# Patient Record
Sex: Male | Born: 1955 | Race: White | Hispanic: No | Marital: Married | Smoking: Former smoker
Health system: Southern US, Community
[De-identification: ages and names within clinical notes are randomized; demographics above are authoritative.]

## PROBLEM LIST (undated history)

## (undated) DIAGNOSIS — F988 Other specified behavioral and emotional disorders with onset usually occurring in childhood and adolescence: Secondary | ICD-10-CM

## (undated) DIAGNOSIS — F32A Depression, unspecified: Secondary | ICD-10-CM

## (undated) DIAGNOSIS — K219 Gastro-esophageal reflux disease without esophagitis: Secondary | ICD-10-CM

## (undated) DIAGNOSIS — F329 Major depressive disorder, single episode, unspecified: Secondary | ICD-10-CM

## (undated) DIAGNOSIS — T7840XA Allergy, unspecified, initial encounter: Secondary | ICD-10-CM

## (undated) HISTORY — PX: HERNIA REPAIR: SHX51

## (undated) HISTORY — DX: Allergy, unspecified, initial encounter: T78.40XA

## (undated) HISTORY — DX: Gastro-esophageal reflux disease without esophagitis: K21.9

## (undated) HISTORY — PX: LASIK: SHX215

---

## 2000-04-22 ENCOUNTER — Ambulatory Visit (HOSPITAL_COMMUNITY): Admission: RE | Admit: 2000-04-22 | Discharge: 2000-04-22 | Payer: Self-pay | Admitting: *Deleted

## 2000-04-22 ENCOUNTER — Encounter: Payer: Self-pay | Admitting: Internal Medicine

## 2001-06-20 ENCOUNTER — Ambulatory Visit (HOSPITAL_BASED_OUTPATIENT_CLINIC_OR_DEPARTMENT_OTHER): Admission: RE | Admit: 2001-06-20 | Discharge: 2001-06-20 | Payer: Self-pay

## 2002-04-13 ENCOUNTER — Ambulatory Visit (HOSPITAL_COMMUNITY): Admission: RE | Admit: 2002-04-13 | Discharge: 2002-04-13 | Payer: Self-pay | Admitting: Internal Medicine

## 2002-04-13 ENCOUNTER — Encounter: Payer: Self-pay | Admitting: Internal Medicine

## 2004-05-09 ENCOUNTER — Ambulatory Visit (HOSPITAL_COMMUNITY): Admission: RE | Admit: 2004-05-09 | Discharge: 2004-05-09 | Payer: Self-pay | Admitting: Internal Medicine

## 2005-05-25 ENCOUNTER — Ambulatory Visit: Payer: Self-pay | Admitting: Gastroenterology

## 2005-06-29 ENCOUNTER — Ambulatory Visit: Payer: Self-pay | Admitting: Gastroenterology

## 2010-07-06 ENCOUNTER — Encounter: Payer: Self-pay | Admitting: Gastroenterology

## 2010-07-20 NOTE — Letter (Signed)
Summary: Colonoscopy Date Change Letter  [facility]  [address]   [place], [place] [address]   Phone: [phone]  Fax: [phone]      July 06, 2010 MRN: [id number]   [name] [address] [place], [place]  [address]   Dear Mr. MCKEITHAN,   Previously you were recommended to have a repeat colonoscopy around this time. Your chart was recently reviewed by Dr. Jarold Motto of [facility]. Follow up colonoscopy is now recommended in January 2017. This revised recommendation is based on current, nationally recognized guidelines for colorectal cancer screening and polyp surveillance. These guidelines are endorsed by the American Cancer Society, [employer] on Colorectal Cancer as well as numerous other major medical organizations.  Please understand that our recommendation assumes that you do not have any new symptoms such as bleeding, a change in bowel habits, anemia, or significant abdominal discomfort. If you do have any concerning GI symptoms or want to discuss the guideline recommendations, please call to arrange an office visit at your earliest convenience. Otherwise we will keep you in our reminder system and contact you 1-2 months prior to the date listed above to schedule your next colonoscopy.  Thank you,   [facility] [phone]

## 2010-11-13 ENCOUNTER — Emergency Department (HOSPITAL_COMMUNITY): Payer: 59

## 2010-11-13 ENCOUNTER — Emergency Department (HOSPITAL_COMMUNITY)
Admission: EM | Admit: 2010-11-13 | Discharge: 2010-11-13 | Disposition: A | Discharge status: Home / Self Care | Payer: 59 | Attending: Emergency Medicine | Admitting: Emergency Medicine

## 2010-11-13 DIAGNOSIS — Y93E9 Activity, other interior property and clothing maintenance: Secondary | ICD-10-CM | POA: Insufficient documentation

## 2010-11-13 DIAGNOSIS — S62109A Fracture of unspecified carpal bone, unspecified wrist, initial encounter for closed fracture: Secondary | ICD-10-CM | POA: Insufficient documentation

## 2010-11-13 DIAGNOSIS — R296 Repeated falls: Secondary | ICD-10-CM | POA: Insufficient documentation

## 2011-02-03 ENCOUNTER — Emergency Department (HOSPITAL_COMMUNITY): Payer: 59

## 2011-02-03 ENCOUNTER — Observation Stay (HOSPITAL_COMMUNITY)
Admission: EM | Admit: 2011-02-03 | Discharge: 2011-02-04 | Disposition: A | Discharge status: Home / Self Care | Payer: 59 | Attending: Internal Medicine | Admitting: Internal Medicine

## 2011-02-03 DIAGNOSIS — F988 Other specified behavioral and emotional disorders with onset usually occurring in childhood and adolescence: Secondary | ICD-10-CM | POA: Insufficient documentation

## 2011-02-03 DIAGNOSIS — R110 Nausea: Secondary | ICD-10-CM | POA: Insufficient documentation

## 2011-02-03 DIAGNOSIS — G319 Degenerative disease of nervous system, unspecified: Secondary | ICD-10-CM | POA: Insufficient documentation

## 2011-02-03 DIAGNOSIS — R42 Dizziness and giddiness: Secondary | ICD-10-CM | POA: Insufficient documentation

## 2011-02-03 DIAGNOSIS — R9431 Abnormal electrocardiogram [ECG] [EKG]: Secondary | ICD-10-CM | POA: Insufficient documentation

## 2011-02-03 DIAGNOSIS — R51 Headache: Secondary | ICD-10-CM | POA: Insufficient documentation

## 2011-02-03 DIAGNOSIS — R55 Syncope and collapse: Principal | ICD-10-CM | POA: Insufficient documentation

## 2011-02-03 DIAGNOSIS — F3289 Other specified depressive episodes: Secondary | ICD-10-CM | POA: Insufficient documentation

## 2011-02-03 DIAGNOSIS — F329 Major depressive disorder, single episode, unspecified: Secondary | ICD-10-CM | POA: Insufficient documentation

## 2011-02-03 LAB — DIFFERENTIAL
Eosinophils Relative: 1 % (ref 0–5)
Lymphocytes Relative: 15 % (ref 12–46)
Lymphs Abs: 1.2 10*3/uL (ref 0.7–4.0)
Monocytes Absolute: 0.5 10*3/uL (ref 0.1–1.0)
Monocytes Relative: 6 % (ref 3–12)

## 2011-02-03 LAB — CK TOTAL AND CKMB (NOT AT ARMC): CK, MB: 3.3 ng/mL (ref 0.3–4.0)

## 2011-02-03 LAB — BASIC METABOLIC PANEL
BUN: 10 mg/dL (ref 6–23)
Creatinine, Ser: 0.79 mg/dL (ref 0.50–1.35)
GFR calc Af Amer: >60 mL/min (ref >60)
GFR calc non Af Amer: >60 mL/min (ref >60)

## 2011-02-03 LAB — CBC
HCT: 41.1 % (ref 39.0–52.0)
MCHC: 34.3 g/dL (ref 30.0–36.0)
MCV: 95.1 fL (ref 78.0–100.0)
RDW: 12.7 % (ref 11.5–15.5)

## 2011-02-03 LAB — D-DIMER, QUANTITATIVE: D-Dimer, Quant: <0.22 ug/mL-FEU (ref 0.00–0.48)

## 2011-02-03 LAB — TROPONIN I: Troponin I: <0.3 ng/mL (ref <0.30)

## 2011-02-03 LAB — GLUCOSE, CAPILLARY: Glucose-Capillary: 110 mg/dL — ABNORMAL HIGH (ref 70–99)

## 2011-02-04 ENCOUNTER — Observation Stay (HOSPITAL_COMMUNITY): Payer: 59

## 2011-02-04 DIAGNOSIS — R55 Syncope and collapse: Secondary | ICD-10-CM

## 2011-02-04 LAB — CK TOTAL AND CKMB (NOT AT ARMC)
Relative Index: 3 — ABNORMAL HIGH (ref 0.0–2.5)
Relative Index: INVALID (ref 0.0–2.5)

## 2011-02-04 LAB — TSH: TSH: 0.502 u[IU]/mL (ref 0.350–4.500)

## 2011-02-13 NOTE — Discharge Summary (Signed)
[name], [name]             ACCOUNT NO.:  [id number]  MEDICAL RECORD NO.:  [id number]  LOCATION:  1442                         FACILITY:  [facility]  PHYSICIAN:  Erick Blinks, MD     DATE OF BIRTH:  [birth year]  DATE OF ADMISSION:  02/03/2011 DATE OF DISCHARGE:  02/04/2011                              DISCHARGE SUMMARY   PRIMARY CARE PHYSICIAN:  Lovenia Kim, DO  DISCHARGE DIAGNOSES: 1. Syncope, likely vasovagal, symptoms resolved. 2. Depression. 3. Attention-deficit disorder.  DISCHARGE MEDICATIONS: 1. Lexapro 10 mg p.o. daily. 2. Vyvanse 10 mg p.o. daily. 3. Aspirin 81 mg p.o. daily.  ADMISSION HISTORY:  This is a 55 year old gentleman who has a history of depression and attention-deficit disorder.  The patient was at home when he started to feel poorly, was having some clamminess.  The patient started to feel dizzy.  He had called his wife and upon her arrival at home, she had found him lying on the floor.  He was not unconscious at the time of her arrival, although he did pass out and reports that he does not remember falling to the floor.  His symptoms persisted for 30 to 40 minutes.  He was having some nausea and dizziness, so he was brought to the emergency room and was given some IV fluids.  He was subsequently admitted for further evaluation.  His wife, who is a Engineer, civil (consulting), had stated that she was checking his pulse and noticed that she was feeling some skipped beats.  HOSPITAL COURSE:  Syncope:  The patient was extensively evaluated.  His orthostatics were found to be negative.  He did have an MRI of the brain done which ruled out any acute process.  Cardiac enzymes were found to be negative x3.  Telemetry shows that he has a sinus rhythm without any evidence of PVCs or PACs.  TSH was also found to be normal.  D-dimer was negative.  2-D echocardiogram has been done and results are currently pending.  If his 2-D echo is within normal limits, then he could  likely discharge home at this point and follow up with his primary care physician.  The patient may need a referral to a cardiologist for Event Monitor/Loop Recorder if his symptoms do recur, but this may be conducted on an outpatient basis.  Currently, he is back to his baseline.  He is ambulating without any problems and is requesting to discharge home.  DIAGNOSTIC IMAGING: 1. MRI of the brain shows no acute intracranial findings, major     intracranial vasculature structures patent, minimal atrophy and     minimal white-matter disease. 2. CT head, on admission, shows no acute intracranial abnormalities.  DISCHARGE INSTRUCTIONS:  The patient should continue on a regular diet, conduct his activities as tolerated.  Follow up with his primary care physician in the next 1 to 2 weeks.  Again, the patient's final echo report is pending.  If that is within normal limits, then he will be discharged home later today.     Erick Blinks, MD     JM/MEDQ  D:  02/04/2011  T:  02/04/2011  Job:  [id number]  cc:   Lovenia Kim, D.O.Fax: [phone]  Electronically Signed by Erick Blinks  on 02/13/2011 04:44:11 PM

## 2011-03-28 NOTE — H&P (Signed)
NAMELANDON, Wise NO.:  [id number]  MEDICAL RECORD NO.:  [id number]  LOCATION:  WLED                         FACILITY:  [facility]  PHYSICIAN:  Altha Harm, MDDATE OF BIRTH:  05/20/1956  DATE OF ADMISSION:  02/03/2011 DATE OF DISCHARGE:                             HISTORY & PHYSICAL   CHIEF COMPLAINT:  Syncope.  HISTORY OF PRESENT ILLNESS:  [name] is a 55 year old gentleman with history only of depression and ADD.  He states that on today, he was sitting looking at Television while drinking a beer, noticed that he was having some clamminess and feeling poorly.  The patient said at that time he did not feel any dizziness.  He was able to get up and walk down to the basement, called his wife  on the phone and then come back upstairs then.  The patient spoke with his wife and is a conversation and when the wife arrived at home she found him on the floor of the den. She states when she found him,  he was not passed out, but he did lose some time as he does not remember getting to the floor.  At that time, she said he felt poorly.  The patient states that he sat on the couch and then subsequent to that started having some dizziness.  The patient's wife believes that it was while he was on the floor that he complained of dizziness.  Nevertheless, dizziness he described is more like  motion sickness, which improves when he close his eyes.  He states that persisted for about 30-40 minutes between getting from the house to the emergency room and shortly after being here.  The patient was given some meclizine in the emergency room, which appeared to improve his dizziness.  He was also given some IV fluids.  The patient denies any seizure activity.  He denies any chest pain.  He denies any chest wall tightness.  He denies any loss of bowel or bladder function.  He denies any abdominal pain.  He did have some nausea, which was associated with his first  onset of symptoms, which included the clamminess.  The patient states that since being here in the emergency room, he feels back to his baseline.  He has no further dizziness.  He has no nausea and he has no changes in vision.  The patient did not describe any changes in vision during the episode that he described.  He denies any palpitations, however, his wife who is a nurse states that when she evaluated his pulse, she felt that he was skipping some beats, she states she would feel 4 beats and then a pause and then 9 beats and then a pause in the fashion of someone who is having PVCs or PACs.  PAST MEDICAL HISTORY: 1. Depression. 2. Attention deficit disorder.  FAMILY HISTORY:  Significant for cancer, melanoma in his father and siblings and leukemia in his mother.  SOCIAL HISTORY:  He lives with his wife who can be reached at [phone].  He admits to drinking alcohol occasionally socially.  There is no tobacco use and he denies any drug use.  The patient states that prior  to this they have been doing some painting in a well air-conditioned basement and he had had no symptoms with that.  He states that today was the day that was less strenuous for him than those.  CURRENT MEDICATIONS: 1. Lexapro 10 mg p.o. daily. 2. Vyvanse 10 mg p.o. daily.  ALLERGIES:  No known drug allergies.  PRIMARY CARE PHYSICIAN:  Lovenia Kim, D.O.  REVIEW OF SYSTEMS:  All other systems negative except as noted in the HPI.  LABORATORY DATA:  Studies in the emergency room show the following, white blood cell count of 7.8, hemoglobin of 14.4, hematocrit of 41.1, platelet count of 196.  Sodium 132, potassium 3.6, chloride 97, bicarb 23, BUN 10, creatinine 0.79, troponin 0.00.  CT of head is negative for any acute intracranial abnormalities.  PHYSICAL EXAMINATION:  GENERAL:  The patient is well appearing, laying in bed with no distress, and not complaining of any symptoms at this time. VITAL  SIGNS:  Temperature is 98, heart rate 96, blood pressure 124/85, respiratory rate 20, O2 sats are 100% on room air. HEENT:  He is normocephalic, atraumatic.  Pupils equally round and reactive to light and accommodation.  Extraocular movements are intact. Oropharynx is moist.  No exudate, erythema or lesions are noted. NECK:  Trachea is midline.  No masses.  No thyromegaly.  No JVD.  No carotid bruit. RESPIRATORY:  He has got a normal respiratory effort, equal excursion bilaterally.  No wheezing or rhonchi noted. CARDIOVASCULAR:  He has got a normal S1, S2.  No murmurs, rubs, or gallops noted.  PMI is nondisplaced.  No heaves or thrills on palpation. ABDOMEN:  Obese, soft, nontender, nondistended.  No masses.  No hepatosplenomegaly is noted. EXTREMITIES:  No clubbing, cyanosis, or edema. LYMPH NODE SURVEY:  He has got no cervical, axillary, or inguinal lymphadenopathy noted. PSYCHIATRIC:  He is alert and oriented x3.  Good insight and cognition. Good recent and remote recall. NEUROLOGIC:  He has got no focal neurological deficits.  Cranial nerves II through XII are grossly intact.  DTRs are 2+ bilaterally in the upper and lower extremities.  Please note, the patient had some very, very minuscule amount of nystagmus horizontally to the right, but no other abnormalities noted.  ASSESSMENT/PLAN:  This patient who presents with syncope.  His symptoms point to likely some arrhythmia causing a decrease in perfusion.  It is very unlikely that this patient could have a neurological event that would have a reversal of symptoms separately.  The other concern would be for pulmonary embolus with symptoms occurring at the time that the embolus was occurring.  In light of these concerns, I will go ahead and get a 2-D echocardiogram for the patient and admit the patient to telemetry unit and monitor his heart rhythm.  We will get a D-dimer to rule out suggestion of a pulmonary embolus.  I will also  get an MRI of the brain to ensure that the patient does not have any posterior fossa abnormalities though less likely.  It is also quite possible this could have been vasovagal for the patient, however, he does not describe anything leading up to this trigger vasovagal response.  We will observe the patient on telemetry, continue his home medications, and check orthostatics on him to make sure that he does have some component of volume depletion.  However, overall this patient describes symptoms that go along with decreased perfusion at the time of the event, which was point, most significantly to the  cardiovascular system.  We will go ahead and do deep venous thrombosis prophylaxis with Lovenox and place the patient on a regular diet. Further therapy will be determined by testing and the patient's clinical course and the patient's initial response to therapy.  Please note that I have not given the patient any meclizine at this time as he has no symptoms at present, however, if the symptoms recurred may be a consideration for him.     Altha Harm, MD     MAM/MEDQ  D:  02/03/2011  T:  02/03/2011  Job:  [id number]  cc:   Lovenia Kim, D.O. Fax: [phone]  Electronically Signed by Marthann Schiller MD on 03/28/2011 11:26:28 PM

## 2011-04-29 ENCOUNTER — Encounter: Payer: Self-pay | Admitting: *Deleted

## 2011-04-29 ENCOUNTER — Emergency Department (HOSPITAL_COMMUNITY)
Admission: EM | Admit: 2011-04-29 | Discharge: 2011-04-30 | Disposition: A | Discharge status: Home / Self Care | Payer: 59 | Attending: Emergency Medicine | Admitting: Emergency Medicine

## 2011-04-29 DIAGNOSIS — W010XXA Fall on same level from slipping, tripping and stumbling without subsequent striking against object, initial encounter: Secondary | ICD-10-CM | POA: Insufficient documentation

## 2011-04-29 DIAGNOSIS — R404 Transient alteration of awareness: Secondary | ICD-10-CM | POA: Insufficient documentation

## 2011-04-29 DIAGNOSIS — R55 Syncope and collapse: Secondary | ICD-10-CM | POA: Insufficient documentation

## 2011-04-29 DIAGNOSIS — R220 Localized swelling, mass and lump, head: Secondary | ICD-10-CM | POA: Insufficient documentation

## 2011-04-29 DIAGNOSIS — R221 Localized swelling, mass and lump, neck: Secondary | ICD-10-CM | POA: Insufficient documentation

## 2011-04-29 DIAGNOSIS — S0180XA Unspecified open wound of other part of head, initial encounter: Secondary | ICD-10-CM | POA: Insufficient documentation

## 2011-04-29 DIAGNOSIS — S0181XA Laceration without foreign body of other part of head, initial encounter: Secondary | ICD-10-CM

## 2011-04-29 DIAGNOSIS — W19XXXA Unspecified fall, initial encounter: Secondary | ICD-10-CM

## 2011-04-29 DIAGNOSIS — S0510XA Contusion of eyeball and orbital tissues, unspecified eye, initial encounter: Secondary | ICD-10-CM | POA: Insufficient documentation

## 2011-04-29 DIAGNOSIS — R413 Other amnesia: Secondary | ICD-10-CM | POA: Insufficient documentation

## 2011-04-29 DIAGNOSIS — S0990XA Unspecified injury of head, initial encounter: Secondary | ICD-10-CM | POA: Insufficient documentation

## 2011-04-29 HISTORY — DX: Major depressive disorder, single episode, unspecified: F32.9

## 2011-04-29 HISTORY — DX: Other specified behavioral and emotional disorders with onset usually occurring in childhood and adolescence: F98.8

## 2011-04-29 HISTORY — DX: Depression, unspecified: F32.A

## 2011-04-29 MED ORDER — OXYCODONE-ACETAMINOPHEN 5-325 MG PO TABS
1.0000 | ORAL_TABLET | Freq: Once | ORAL | Status: AC
  Administered 2011-04-30: 1 via ORAL
  Filled 2011-04-29: qty 1

## 2011-04-29 MED ORDER — TETANUS-DIPHTH-ACELL PERTUSSIS 5-2.5-18.5 LF-MCG/0.5 IM SUSP
0.5000 mL | Freq: Once | INTRAMUSCULAR | Status: AC
  Administered 2011-04-30: 0.5 mL via INTRAMUSCULAR
  Filled 2011-04-29: qty 0.5

## 2011-04-29 NOTE — ED Notes (Signed)
Pts wife states pt tripped over table fell forward hitting face on tile floor, pts wife state he was unconscious x 1 minute. Pt does not remember incident, remembers watching TV then sitting in chair with family around him. Pt placed in philly collar in triage, lac to forehead bandaged, no active bleeding. cspine non tender. L cheek swollen discolored. Pt c/o pain to L elbow, small superficial lac x 2 noted to elbow.

## 2011-04-29 NOTE — ED Notes (Signed)
Pt tripped and fell, striking L head. Positive LoC, amnesia, head injury/laceration, bleeding controlled w/ bandage.

## 2011-04-29 NOTE — ED Provider Notes (Signed)
History     CSN: [id number] Arrival date & time: 04/29/2011  9:07 PM   First MD Initiated Contact with Patient 04/29/11 2307        (Consider location/radiation/quality/duration/timing/severity/associated sxs/prior treatment) Patient is a 55 y.o. male presenting with fall, head injury, and scalp laceration. The history is provided by the patient and the spouse.  Fall The accident occurred less than 1 hour ago. The fall occurred while walking (He was walking behind a couch and tripped on the leg of a branch that was in an unusual position behind the couch). He fell from a height of 1 to 2 ft. He landed on a hard floor. The point of impact was the head. The pain is moderate. He was ambulatory at the scene. There was no entrapment after the fall. There was no drug use involved in the accident. There was alcohol use involved in the accident. Associated symptoms include loss of consciousness. Pertinent negatives include no visual change, no fever, no abdominal pain, no bowel incontinence, no vomiting and no hearing loss. Associated symptoms comments: Patient was unconscious for 1 minute after the fall. He is amnestic for the event. He does remember getting cleaned up after the event.. The symptoms are aggravated by activity. He has tried nothing for the symptoms.  Head Injury  Pertinent negatives include no vomiting.  Head Laceration Pertinent negatives include no abdominal pain.    Past Medical History  Diagnosis Date  . Depression   . Attention deficit disorder (ADD)     Past Surgical History  Procedure Date  . Hernia repair   . Lasik     No family history on file.  History  Substance Use Topics  . Smoking status: Former Games developer  . Smokeless tobacco: Not on file  . Alcohol Use: 1.2 oz/week    2 Cans of beer per week      Review of Systems  Constitutional: Negative for fever.  Cardiovascular: Positive for syncope.  Gastrointestinal: Negative for vomiting, abdominal pain and  bowel incontinence.  Neurological: Positive for loss of consciousness.  All other systems reviewed and are negative.    Allergies  Review of patient's allergies indicates no known allergies.  Home Medications   Current Outpatient Rx  Name Route Sig Dispense Refill  . AMPHETAMINE-DEXTROAMPHETAMINE 10 MG PO TABS Oral Take 10 mg by mouth daily.      Marland Kitchen ESCITALOPRAM OXALATE 10 MG PO TABS Oral Take 10 mg by mouth daily.        BP 120/76  Pulse 64  Temp 98 F (36.7 C)  Resp 20  SpO2 100%  Physical Exam  Constitutional: He is oriented to person, place, and time. He appears well-developed and well-nourished.  HENT:  Head: Normocephalic.    Right Ear: External ear normal.  Left Ear: External ear normal.  Nose: Nose normal.  Mouth/Throat: Uvula is midline, oropharynx is clear and moist and mucous membranes are normal.       Left periorbital contusions. Left eyebrow laceration.  Eyes: EOM are normal. Pupils are equal, round, and reactive to light. Right eye exhibits no chemosis. Left eye exhibits no chemosis. Right conjunctiva is not injected. Left conjunctiva is not injected.         Left periorbital contusions  Neck: Neck supple.       Wearing a cervical collar applied at triage, no neck tenderness.  Cardiovascular: Normal rate and regular rhythm.   Pulmonary/Chest: Effort normal and breath sounds normal.  Abdominal: Soft. Bowel sounds  are normal.  Genitourinary:       No costal vertebral angle tenderness  Musculoskeletal: Normal range of motion. He exhibits no edema and no tenderness.  Neurological: He is alert and oriented to person, place, and time. No cranial nerve deficit. Coordination normal.  Skin: Skin is warm and dry.  Psychiatric: He has a normal mood and affect. His behavior is normal. Judgment and thought content normal.    ED Course  Procedures (including critical care time) LACERATION REPAIR Performed by: Flint Melter Consent: Verbal consent  obtained. Risks and benefits: risks, benefits and alternatives were discussed Patient identity confirmed: provided demographic data Time out performed prior to procedure Prepped and Draped in normal sterile fashion Wound explored  Laceration Location: left eyebrow  Laceration Length: 4.5cm  No Foreign Bodies seen or palpated  Anesthesia: local infiltration  Local anesthetic: lidocaine 2% with epinephrine  Anesthetic total: 4 ml  Irrigation method: syringe Amount of cleaning: standard  Skin closure: prolene  Number of sutures or staples: 6  Technique: simple  Patient tolerance: Patient tolerated the procedure well with no immediate complications. Labs Reviewed - No data to display Ct Head Wo Contrast  04/30/2011  *RADIOLOGY REPORT*  Clinical Data:  55 year old male status post fall with loss of consciousness.  Laceration, swelling.  CT HEAD WITHOUT CONTRAST CT ORBITS WITHOUT CONTRAST CT CERVICAL SPINE WITHOUT CONTRAST  Technique:  Multidetector CT imaging of the head, cervical spine, and orbit structures were performed using the standard protocol without intravenous contrast. Multiplanar CT image reconstructions of the cervical spine and maxillofacial structures were also generated.  Comparison:  Brain MRI 02/04/2011.  Head CT 02/03/2011.  CT HEAD  Findings: Orbit findings are below.  Mastoids are clear.  Calvarium intact.  Cerebral volume is within normal limits for age.  No midline shift, ventriculomegaly, mass effect, evidence of mass lesion, intracranial hemorrhage or evidence of cortically based acute infarction.  Gray-white matter differentiation is within normal limits throughout the brain.  No suspicious intracranial vascular hyperdensity.  IMPRESSION: 1.  Stable, normal noncontrast CT appearance of the brain. 2.  See orbit findings below.  CT ORBITS  Findings:  Punctate to probable retained foreign bodies about the inferolateral aspect of the left orbit (series 6 image 21).   Globes intact.  Intraorbital soft tissues within normal limits.  Minor ethmoid sinus mucosal thickening.  Chronic inferior left maxillary mucous retention cyst.  Chronic-appearing left anterior zygomatic arch fracture.  No anal orbital wall fracture or acute nearby facial fracture identified. Soft tissue laceration superior to the left orbit (series 5 image 37) underlying frontal bone appears intact.  IMPRESSION: 1.  Left periorbital soft tissue injury without associated fracture. 2.  Suspect punctate retained foreign bodies along the inferolateral left orbit (series 6 image 21). 3.  Cervical findings are below.  CT CERVICAL SPINE  Findings:   Visualized skull base is intact.  No atlanto-occipital dissociation.  Mild reversal of cervical lordosis. Cervicothoracic junction alignment is within normal limits.  Bilateral posterior element alignment is within normal limits.  Visualized paraspinal soft tissues are within normal limits.  No acute cervical spine fracture.  Negative lung apices.  Chronic disc degeneration at C4- C5 and C5-C6 including endplate geode with vacuum phenomena.  No definite cervical spinal stenosis.  IMPRESSION: No acute fracture or listhesis identified in the cervical spine. Ligamentous injury is not excluded.  Original Report Authenticated By: Harley Hallmark, M.D.   Ct Cervical Spine Wo Contrast  04/30/2011  *RADIOLOGY REPORT*  Clinical  Data:  55 year old male status post fall with loss of consciousness.  Laceration, swelling.  CT HEAD WITHOUT CONTRAST CT ORBITS WITHOUT CONTRAST CT CERVICAL SPINE WITHOUT CONTRAST  Technique:  Multidetector CT imaging of the head, cervical spine, and orbit structures were performed using the standard protocol without intravenous contrast. Multiplanar CT image reconstructions of the cervical spine and maxillofacial structures were also generated.  Comparison:  Brain MRI 02/04/2011.  Head CT 02/03/2011.  CT HEAD  Findings: Orbit findings are below.  Mastoids  are clear.  Calvarium intact.  Cerebral volume is within normal limits for age.  No midline shift, ventriculomegaly, mass effect, evidence of mass lesion, intracranial hemorrhage or evidence of cortically based acute infarction.  Gray-white matter differentiation is within normal limits throughout the brain.  No suspicious intracranial vascular hyperdensity.  IMPRESSION: 1.  Stable, normal noncontrast CT appearance of the brain. 2.  See orbit findings below.  CT ORBITS  Findings:  Punctate to probable retained foreign bodies about the inferolateral aspect of the left orbit (series 6 image 21).  Globes intact.  Intraorbital soft tissues within normal limits.  Minor ethmoid sinus mucosal thickening.  Chronic inferior left maxillary mucous retention cyst.  Chronic-appearing left anterior zygomatic arch fracture.  No anal orbital wall fracture or acute nearby facial fracture identified. Soft tissue laceration superior to the left orbit (series 5 image 37) underlying frontal bone appears intact.  IMPRESSION: 1.  Left periorbital soft tissue injury without associated fracture. 2.  Suspect punctate retained foreign bodies along the inferolateral left orbit (series 6 image 21). 3.  Cervical findings are below.  CT CERVICAL SPINE  Findings:   Visualized skull base is intact.  No atlanto-occipital dissociation.  Mild reversal of cervical lordosis. Cervicothoracic junction alignment is within normal limits.  Bilateral posterior element alignment is within normal limits.  Visualized paraspinal soft tissues are within normal limits.  No acute cervical spine fracture.  Negative lung apices.  Chronic disc degeneration at C4- C5 and C5-C6 including endplate geode with vacuum phenomena.  No definite cervical spinal stenosis.  IMPRESSION: No acute fracture or listhesis identified in the cervical spine. Ligamentous injury is not excluded.  Original Report Authenticated By: Harley Hallmark, M.D.   Ct Orbitss W/o Cm  04/30/2011   *RADIOLOGY REPORT*  Clinical Data:  55 year old male status post fall with loss of consciousness.  Laceration, swelling.  CT HEAD WITHOUT CONTRAST CT ORBITS WITHOUT CONTRAST CT CERVICAL SPINE WITHOUT CONTRAST  Technique:  Multidetector CT imaging of the head, cervical spine, and orbit structures were performed using the standard protocol without intravenous contrast. Multiplanar CT image reconstructions of the cervical spine and maxillofacial structures were also generated.  Comparison:  Brain MRI 02/04/2011.  Head CT 02/03/2011.  CT HEAD  Findings: Orbit findings are below.  Mastoids are clear.  Calvarium intact.  Cerebral volume is within normal limits for age.  No midline shift, ventriculomegaly, mass effect, evidence of mass lesion, intracranial hemorrhage or evidence of cortically based acute infarction.  Gray-white matter differentiation is within normal limits throughout the brain.  No suspicious intracranial vascular hyperdensity.  IMPRESSION: 1.  Stable, normal noncontrast CT appearance of the brain. 2.  See orbit findings below.  CT ORBITS  Findings:  Punctate to probable retained foreign bodies about the inferolateral aspect of the left orbit (series 6 image 21).  Globes intact.  Intraorbital soft tissues within normal limits.  Minor ethmoid sinus mucosal thickening.  Chronic inferior left maxillary mucous retention cyst.  Chronic-appearing left anterior  zygomatic arch fracture.  No anal orbital wall fracture or acute nearby facial fracture identified. Soft tissue laceration superior to the left orbit (series 5 image 37) underlying frontal bone appears intact.  IMPRESSION: 1.  Left periorbital soft tissue injury without associated fracture. 2.  Suspect punctate retained foreign bodies along the inferolateral left orbit (series 6 image 21). 3.  Cervical findings are below.  CT CERVICAL SPINE  Findings:   Visualized skull base is intact.  No atlanto-occipital dissociation.  Mild reversal of cervical  lordosis. Cervicothoracic junction alignment is within normal limits.  Bilateral posterior element alignment is within normal limits.  Visualized paraspinal soft tissues are within normal limits.  No acute cervical spine fracture.  Negative lung apices.  Chronic disc degeneration at C4- C5 and C5-C6 including endplate geode with vacuum phenomena.  No definite cervical spinal stenosis.  IMPRESSION: No acute fracture or listhesis identified in the cervical spine. Ligamentous injury is not excluded.  Original Report Authenticated By: Harley Hallmark, M.D.   2:35 AM  patient is calm and alert at this time. Has no further complaints. There's been no vomiting, and physical exam is unchanged  1. Fall   2. Head injury   3. Laceration of face       MDM  Mechanical fall with head injury and brief loss of consciousness, patient improved to baseline quickly and remained there during ER course. Further management in the ED or hospitalization or not required at this time.        Flint Melter, MD 04/30/11 [phone]

## 2011-04-30 ENCOUNTER — Emergency Department (HOSPITAL_COMMUNITY): Payer: 59

## 2011-04-30 ENCOUNTER — Other Ambulatory Visit (HOSPITAL_COMMUNITY): Payer: 59

## 2011-04-30 MED ORDER — LIDOCAINE-EPINEPHRINE (PF) 1 %-1:200000 IJ SOLN
INTRAMUSCULAR | Status: AC
  Administered 2011-04-30: 02:00:00
  Filled 2011-04-30: qty 10

## 2011-04-30 NOTE — ED Notes (Signed)
Pt awaiting suturing

## 2011-04-30 NOTE — ED Notes (Signed)
Pt to CT via stretcher

## 2011-04-30 NOTE — ED Notes (Signed)
MD at bedside for suturing 

## 2013-01-29 ENCOUNTER — Ambulatory Visit (HOSPITAL_COMMUNITY)
Admission: RE | Admit: 2013-01-29 | Discharge: 2013-01-29 | Disposition: A | Discharge status: Home / Self Care | Payer: 59 | Source: Ambulatory Visit | Attending: Physician Assistant | Admitting: Physician Assistant

## 2013-01-29 ENCOUNTER — Other Ambulatory Visit (HOSPITAL_COMMUNITY): Payer: Self-pay | Admitting: Physician Assistant

## 2013-01-29 DIAGNOSIS — S139XXA Sprain of joints and ligaments of unspecified parts of neck, initial encounter: Secondary | ICD-10-CM

## 2013-01-29 DIAGNOSIS — M4802 Spinal stenosis, cervical region: Secondary | ICD-10-CM | POA: Insufficient documentation

## 2013-01-29 DIAGNOSIS — M5137 Other intervertebral disc degeneration, lumbosacral region: Secondary | ICD-10-CM | POA: Insufficient documentation

## 2013-01-29 DIAGNOSIS — M51379 Other intervertebral disc degeneration, lumbosacral region without mention of lumbar back pain or lower extremity pain: Secondary | ICD-10-CM | POA: Insufficient documentation

## 2013-05-12 ENCOUNTER — Telehealth: Payer: Self-pay | Admitting: *Deleted

## 2013-05-12 MED ORDER — LISDEXAMFETAMINE DIMESYLATE 20 MG PO CAPS: 20.0000 mg | ORAL_CAPSULE | Freq: Every day | ORAL | Status: DC

## 2013-05-12 NOTE — Telephone Encounter (Signed)
Rx = refill   Vyvanse  Call pt when ready   [phone]

## 2013-06-04 ENCOUNTER — Other Ambulatory Visit: Payer: Self-pay | Admitting: Internal Medicine

## 2013-06-04 DIAGNOSIS — G4700 Insomnia, unspecified: Secondary | ICD-10-CM

## 2013-06-04 MED ORDER — ALPRAZOLAM 1 MG PO TABS: ORAL_TABLET | ORAL | Status: DC

## 2013-06-15 ENCOUNTER — Other Ambulatory Visit: Payer: Self-pay | Admitting: Physician Assistant

## 2013-06-15 MED ORDER — LISDEXAMFETAMINE DIMESYLATE 20 MG PO CAPS: 20.0000 mg | ORAL_CAPSULE | Freq: Every day | ORAL | Status: DC

## 2013-07-22 ENCOUNTER — Other Ambulatory Visit: Payer: Self-pay | Admitting: Physician Assistant

## 2013-07-22 MED ORDER — LISDEXAMFETAMINE DIMESYLATE 20 MG PO CAPS: 20.0000 mg | ORAL_CAPSULE | Freq: Every day | ORAL | Status: DC

## 2013-08-25 ENCOUNTER — Other Ambulatory Visit: Payer: Self-pay | Admitting: Physician Assistant

## 2013-08-25 MED ORDER — LISDEXAMFETAMINE DIMESYLATE 20 MG PO CAPS: 20.0000 mg | ORAL_CAPSULE | Freq: Every day | ORAL | Status: AC

## 2013-08-31 ENCOUNTER — Other Ambulatory Visit: Payer: Self-pay | Admitting: Internal Medicine

## 2013-11-27 ENCOUNTER — Telehealth: Payer: Self-pay | Admitting: *Deleted

## 2013-11-27 ENCOUNTER — Other Ambulatory Visit: Payer: Self-pay | Admitting: Emergency Medicine

## 2013-11-27 MED ORDER — ALPRAZOLAM 1 MG PO TABS: ORAL_TABLET | ORAL | Status: DC

## 2013-11-27 NOTE — Telephone Encounter (Signed)
REFILL = XANAX  PT DIDN'T HAVE BOTTLE TO CALL TO GET REFILLS

## 2013-11-30 DIAGNOSIS — F988 Other specified behavioral and emotional disorders with onset usually occurring in childhood and adolescence: Secondary | ICD-10-CM | POA: Insufficient documentation

## 2013-11-30 DIAGNOSIS — F32A Depression, unspecified: Secondary | ICD-10-CM | POA: Insufficient documentation

## 2013-11-30 DIAGNOSIS — K219 Gastro-esophageal reflux disease without esophagitis: Secondary | ICD-10-CM | POA: Insufficient documentation

## 2013-11-30 DIAGNOSIS — T7840XA Allergy, unspecified, initial encounter: Secondary | ICD-10-CM | POA: Insufficient documentation

## 2013-11-30 DIAGNOSIS — F329 Major depressive disorder, single episode, unspecified: Secondary | ICD-10-CM | POA: Insufficient documentation

## 2013-12-01 ENCOUNTER — Encounter: Payer: Self-pay | Admitting: Physician Assistant

## 2013-12-01 ENCOUNTER — Ambulatory Visit (INDEPENDENT_AMBULATORY_CARE_PROVIDER_SITE_OTHER): Payer: Self-pay | Admitting: Physician Assistant

## 2013-12-01 VITALS — BP 120/78 | HR 84 | Temp 98.1°F | Resp 16 | Ht 74.0 in | Wt 193.0 lb

## 2013-12-01 DIAGNOSIS — E559 Vitamin D deficiency, unspecified: Secondary | ICD-10-CM

## 2013-12-01 DIAGNOSIS — F32A Depression, unspecified: Secondary | ICD-10-CM

## 2013-12-01 DIAGNOSIS — K219 Gastro-esophageal reflux disease without esophagitis: Secondary | ICD-10-CM

## 2013-12-01 DIAGNOSIS — F3289 Other specified depressive episodes: Secondary | ICD-10-CM

## 2013-12-01 DIAGNOSIS — E785 Hyperlipidemia, unspecified: Secondary | ICD-10-CM

## 2013-12-01 DIAGNOSIS — F988 Other specified behavioral and emotional disorders with onset usually occurring in childhood and adolescence: Secondary | ICD-10-CM

## 2013-12-01 DIAGNOSIS — F329 Major depressive disorder, single episode, unspecified: Secondary | ICD-10-CM

## 2013-12-01 DIAGNOSIS — Z79899 Other long term (current) drug therapy: Secondary | ICD-10-CM

## 2013-12-01 MED ORDER — AMPHETAMINE-DEXTROAMPHETAMINE 20 MG PO TABS: 20.0000 mg | ORAL_TABLET | Freq: Two times a day (BID) | ORAL | Status: DC

## 2013-12-01 MED ORDER — AMPHETAMINE-DEXTROAMPHETAMINE 20 MG PO TABS: 20.0000 mg | ORAL_TABLET | Freq: Two times a day (BID) | ORAL | Status: AC

## 2013-12-01 NOTE — Progress Notes (Signed)
Assessment and Plan:  Hypertension: Continue medication, monitor blood pressure at home. Continue DASH diet. Cholesterol: Continue diet and exercise.  Vitamin D Def- continue medications.  ADD: Adderall 20mg  BID #60 NR will post date for 2 months.   Continue diet and meds as discussed. Further disposition pending results of labs. Will follow up in 3 months with insurance and we will check labs at that time  HPI 58 y.o. male  presents for 3 month follow up with hypertension, hyperlipidemia, prediabetes and vitamin D. His blood pressure has been controlled at home, today their BP is BP: 120/78 mmHg He does workout. He denies chest pain, shortness of breath, dizziness.  He is not on cholesterol medication and denies myalgias. His cholesterol is at goal. The cholesterol last visit was:  LDL 73  Last A1C in the office was: 5.1 Patient is on Vitamin D supplement.   [place], she has him on [place] and Vyvanse and he is doing well on this combination however he is no longer on his ex [employer] insurance and would like to go back on Adderall for the time being.    Current Medications:  Current Outpatient Prescriptions on File Prior to Visit  Medication Sig Dispense Refill  . ALPRAZolam (XANAX) 1 MG tablet TAKE ONE HALF TO 1 TABLET BY MOUTH TWICE DAILY AS NEEDED AND 1 TABLET AT NIGHT  90 tablet  0  . amphetamine-dextroamphetamine (ADDERALL) 10 MG tablet Take 10 mg by mouth daily.        Marland Kitchen. escitalopram (LEXAPRO) 10 MG tablet Take 10 mg by mouth daily.        Marland Kitchen. lisdexamfetamine (VYVANSE) 20 MG capsule Take 1 capsule (20 mg total) by mouth daily.  30 capsule  0   No current facility-administered medications on file prior to visit.   Medical History:  Past Medical History  Diagnosis Date  . Attention deficit disorder (ADD)   . Depression   . Allergy   . GERD (gastroesophageal reflux disease)    Allergies: No Known Allergies   Review of Systems: [X]  = complains of   [ ]  = denies  General: Fatigue [ ]  Fever [ ]  Chills [ ]  Weakness [ ]   Insomnia [ ]  Eyes: Redness [ ]  Blurred vision [ ]  Diplopia [ ]   ENT: Congestion [ ]  Sinus Pain [ ]  Post Nasal Drip [ ]  Sore Throat [ ]  Earache [ ]   Cardiac: Chest pain/pressure [ ]  SOB [ ]  Orthopnea [ ]   Palpitations [ ]   Paroxysmal nocturnal dyspnea[ ]  Claudication [ ]  Edema [ ]   Pulmonary: Cough [ ]  Wheezing[ ]   SOB [ ]   Snoring [ ]   GI: Nausea [ ]  Vomiting[ ]  Dysphagia[ ]  Heartburn[ ]  Abdominal pain [ ]  Constipation [ ] ; Diarrhea [ ] ; BRBPR [ ]  Melena[ ]  GU: Hematuria[ ]  Dysuria [ ]  Nocturia[ ]  Urgency [ ]   Hesitancy [ ]  Discharge [ ]  Neuro: Headaches[ ]  Vertigo[ ]  Paresthesias[ ]  Spasm [ ]  Speech changes [ ]  Incoordination [ ]   Ortho: Arthritis [ ]  Joint pain [ ]  Muscle pain [ ]  Joint swelling [ ]  Back Pain [ ]  Skin:  Rash [ ]   Pruritis [ ]  Change in skin lesion [ ]   Psych: Depression[ ]  Anxiety[ ]  Confusion [ ]  Memory loss [ ]   Heme/Lypmh: Bleeding [ ]  Bruising [ ]  Enlarged lymph nodes [ ]   Endocrine: Visual blurring [ ]  Paresthesia [ ]  Polyuria [ ]  Polydypsea [ ]   Heat/cold intolerance [ ]  Hypoglycemia [ ]   Family history- Review and unchanged Social history- Review and unchanged Physical Exam: BP 120/78  Pulse 84  Temp(Src) 98.1 F (36.7 C)  Resp 16  Ht 6\' 2"  (1.88 m)  Wt 193 lb (87.544 kg)  BMI 24.77 kg/m2 Wt Readings from Last 3 Encounters:  12/01/13 193 lb (87.544 kg)   General Appearance: Well nourished, in no apparent distress. Eyes: PERRLA, EOMs, conjunctiva no swelling or erythema Sinuses: No Frontal/maxillary tenderness ENT/Mouth: Ext aud canals clear, TMs without erythema, bulging. No erythema, swelling, or exudate on post pharynx.  Tonsils not swollen or erythematous. Hearing normal.  Neck: Supple, thyroid normal.  Respiratory: Respiratory effort normal, BS equal bilaterally without rales, rhonchi, wheezing or stridor.  Cardio: RRR with no MRGs. Brisk peripheral pulses without edema.   Abdomen: Soft, + BS.  Non tender, no guarding, rebound, hernias, masses. Lymphatics: Non tender without lymphadenopathy.  Musculoskeletal: Full ROM, 5/5 strength, normal gait.  Skin: Warm, dry without rashes, lesions, ecchymosis.  Neuro: Cranial nerves intact. Normal muscle tone, no cerebellar symptoms. Sensation intact.  Psych: Awake and oriented X 3, normal affect, Insight and Judgment appropriate.    Quentin Mullingollier, Amanda 11:53 AM

## 2014-01-19 ENCOUNTER — Encounter: Payer: Self-pay | Admitting: Physician Assistant

## 2014-03-16 ENCOUNTER — Encounter: Payer: Self-pay | Admitting: Physician Assistant

## 2014-04-01 ENCOUNTER — Other Ambulatory Visit: Payer: Self-pay | Admitting: Internal Medicine

## 2014-04-01 ENCOUNTER — Other Ambulatory Visit: Payer: Self-pay | Admitting: Emergency Medicine

## 2014-04-01 DIAGNOSIS — F4323 Adjustment disorder with mixed anxiety and depressed mood: Secondary | ICD-10-CM

## 2014-04-01 DIAGNOSIS — F411 Generalized anxiety disorder: Secondary | ICD-10-CM

## 2014-04-01 MED ORDER — ALPRAZOLAM 1 MG PO TABS: ORAL_TABLET | ORAL | Status: AC

## 2015-02-03 IMAGING — CR DG CERVICAL SPINE COMPLETE 4+V
6 series · 6 of 6 positions shown · non-contrast
Comparison: None.

CLINICAL DATA: Neck sprain

CERVICAL SPINE - COMPLETE 4+ VIEW

[view not recorded (1 of 6)]
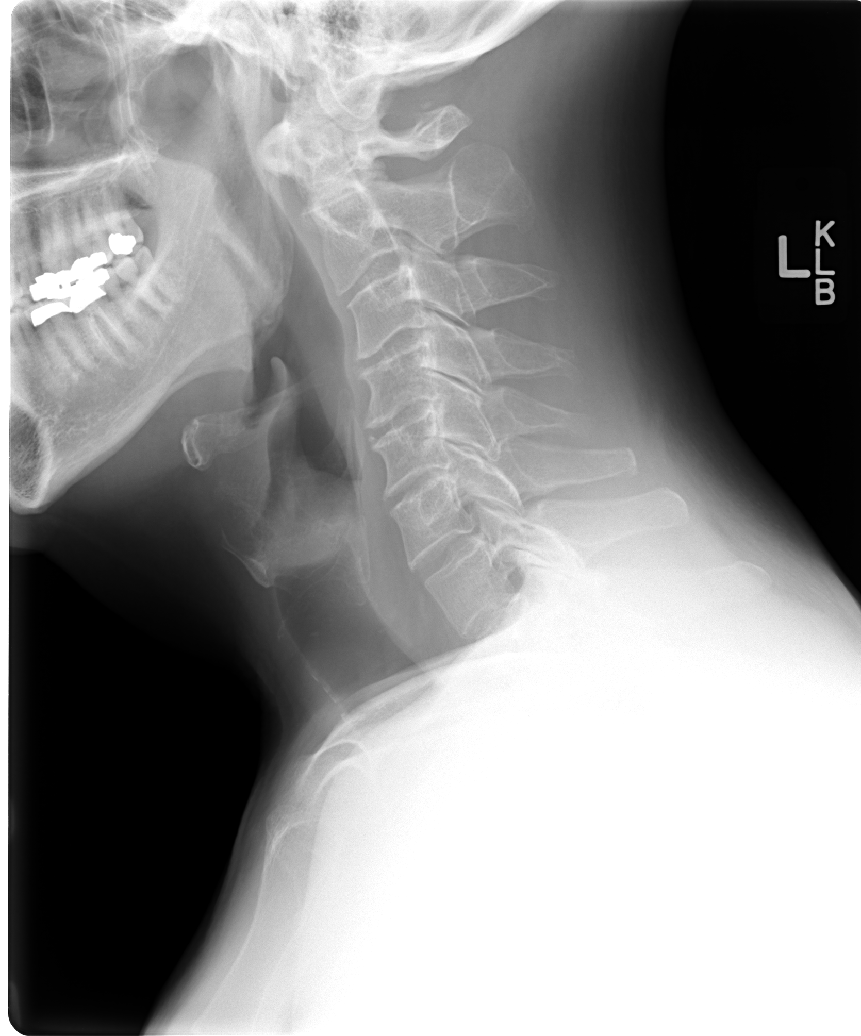

[view not recorded (2 of 6)]
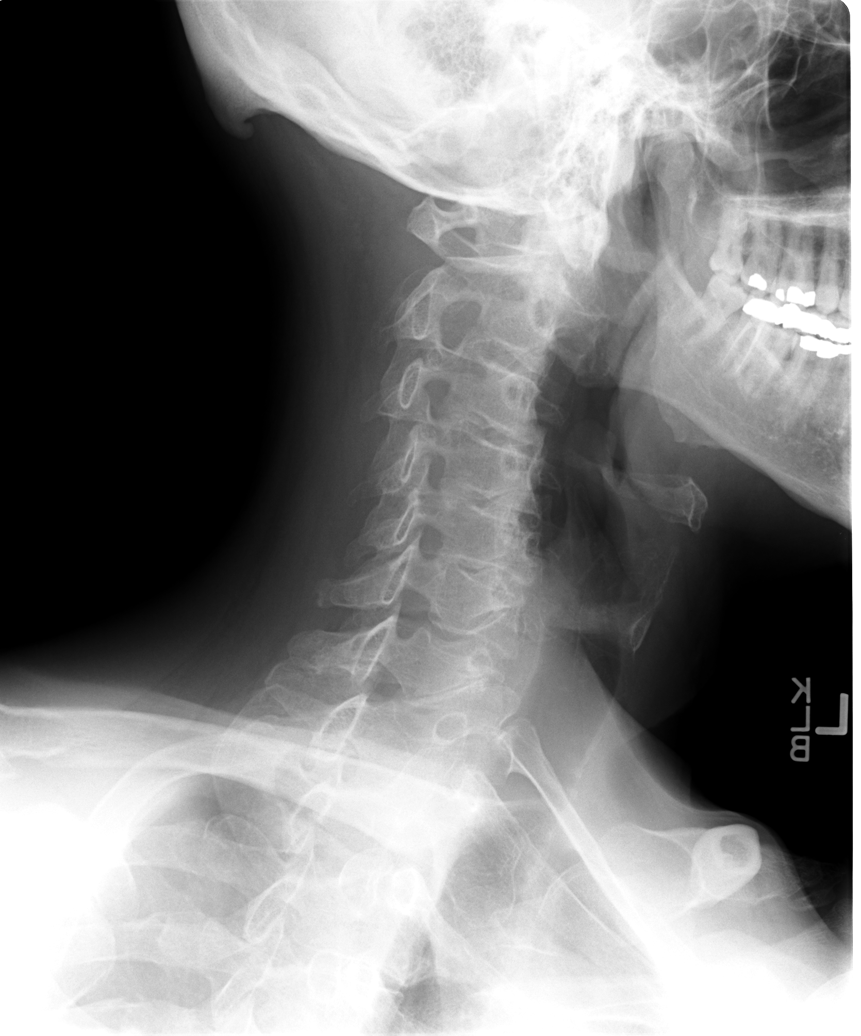

[view not recorded (3 of 6)]
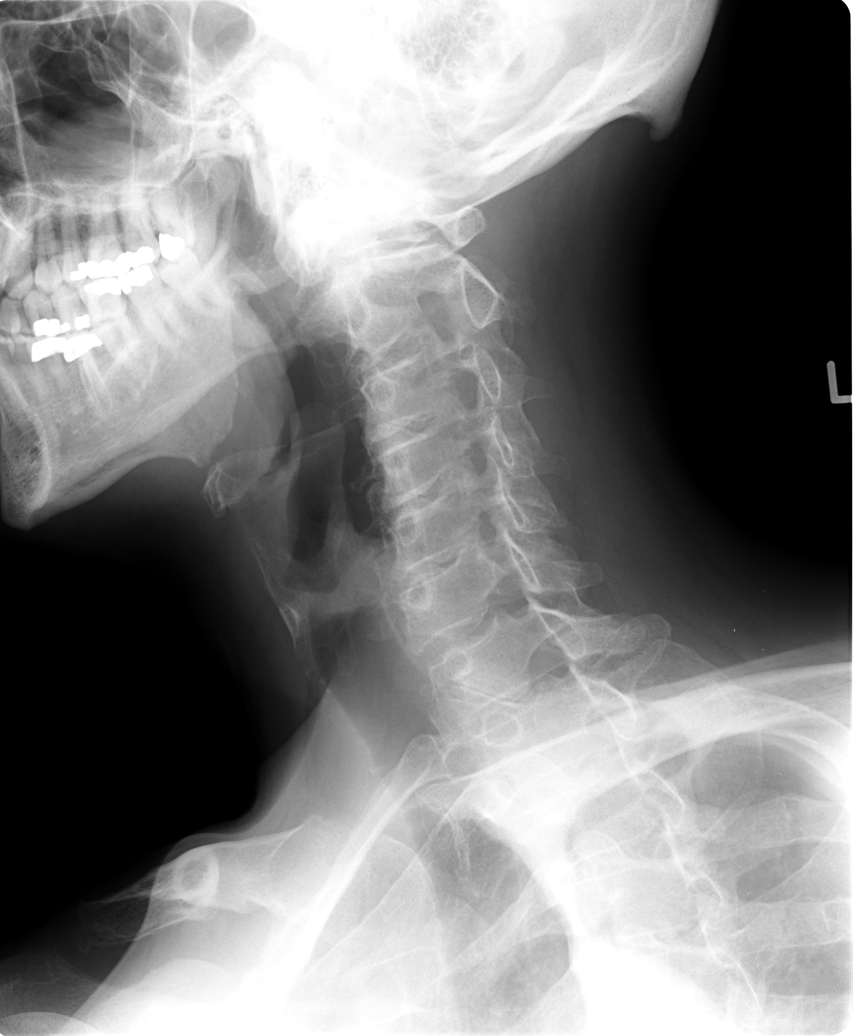

[view not recorded (4 of 6)]
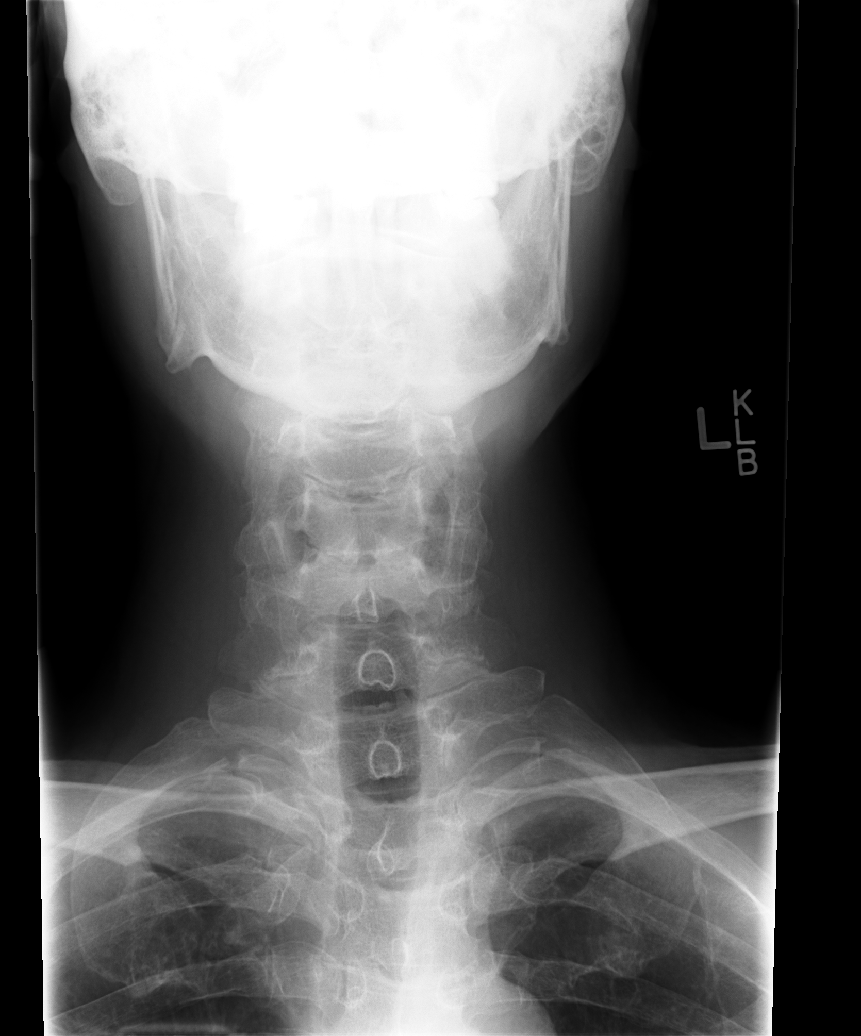

[view not recorded (5 of 6)]
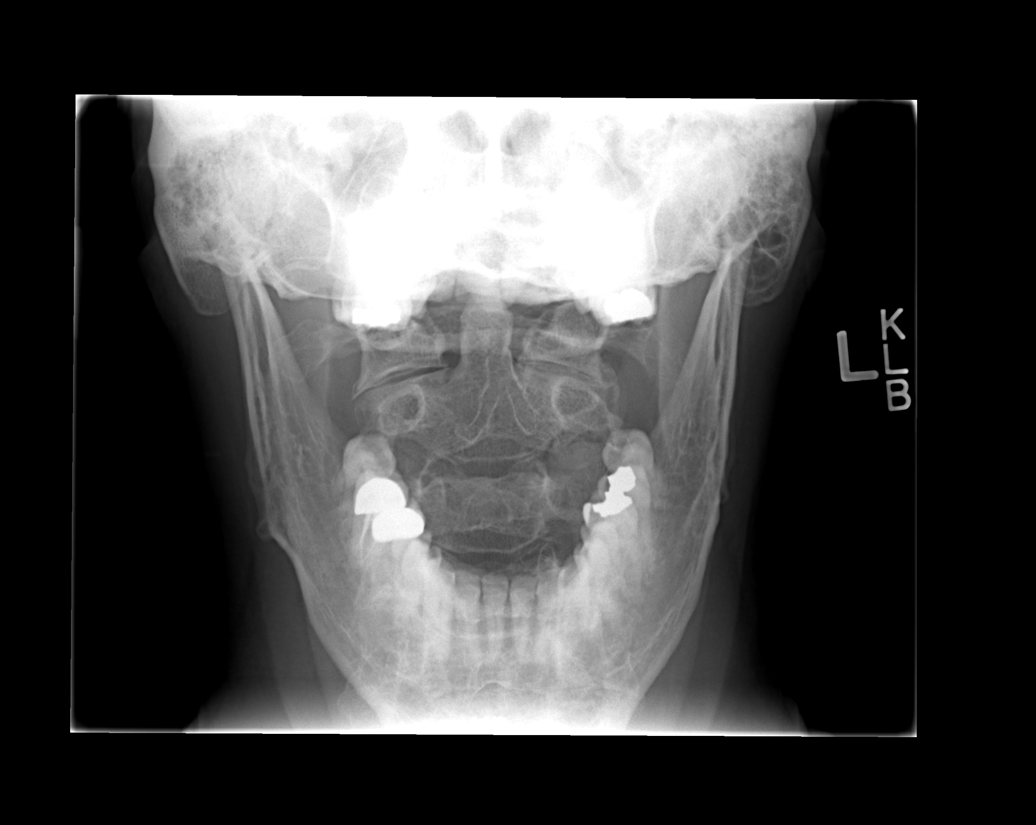

[view not recorded (6 of 6)]
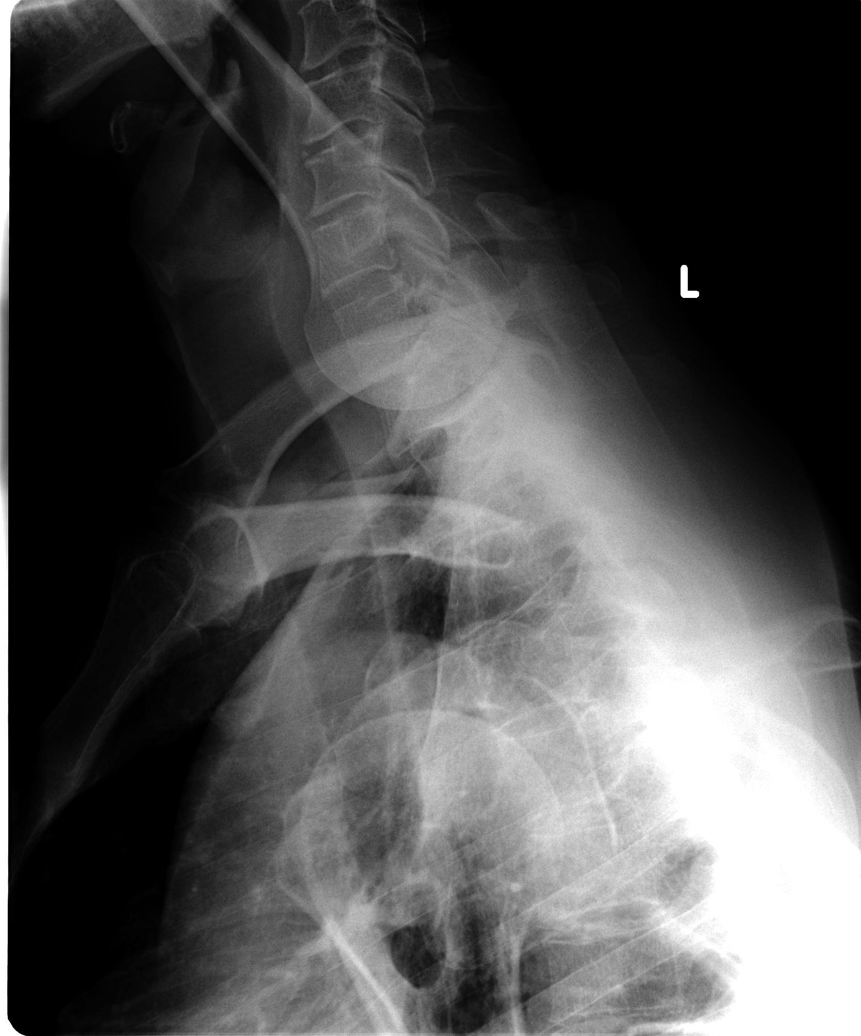

[6 of 6 positions shown; findings below may reference images not displayed]

FINDINGS: Reversal of normal lordosis is noted in the upper
cervical spine.  No fracture or spondylolisthesis is noted.
Degenerative disc disease is noted at C4-5 and C5-6.  Mild neural
foraminal stenosis is noted on the left at C4-5 and C5-6.
IMPRESSION: Mild degenerative disc disease is noted at C4-5 and C5-6. Reversal
of normal lordosis of upper cervical spine is noted most likely
positional in origin.  No definite fracture or spondylolisthesis is
noted.

## 2015-08-15 ENCOUNTER — Encounter: Payer: Self-pay | Admitting: Gastroenterology
